# Patient Record
Sex: Male | Born: 1960 | Race: Black or African American | Hispanic: No | Marital: Married | Smoking: Never smoker
Health system: Southern US, Community
[De-identification: ages and names within clinical notes are randomized; demographics above are authoritative.]

## PROBLEM LIST (undated history)

## (undated) DIAGNOSIS — I5030 Unspecified diastolic (congestive) heart failure: Secondary | ICD-10-CM

## (undated) DIAGNOSIS — I4891 Unspecified atrial fibrillation: Secondary | ICD-10-CM

## (undated) DIAGNOSIS — G4730 Sleep apnea, unspecified: Secondary | ICD-10-CM

## (undated) DIAGNOSIS — I10 Essential (primary) hypertension: Secondary | ICD-10-CM

## (undated) DIAGNOSIS — I7121 Aneurysm of the ascending aorta, without rupture: Secondary | ICD-10-CM

## (undated) DIAGNOSIS — K760 Fatty (change of) liver, not elsewhere classified: Secondary | ICD-10-CM

## (undated) DIAGNOSIS — F1111 Opioid abuse, in remission: Secondary | ICD-10-CM

## (undated) DIAGNOSIS — K449 Diaphragmatic hernia without obstruction or gangrene: Secondary | ICD-10-CM

## (undated) DIAGNOSIS — K219 Gastro-esophageal reflux disease without esophagitis: Secondary | ICD-10-CM

## (undated) DIAGNOSIS — K429 Umbilical hernia without obstruction or gangrene: Secondary | ICD-10-CM

## (undated) DIAGNOSIS — E119 Type 2 diabetes mellitus without complications: Secondary | ICD-10-CM

## (undated) DIAGNOSIS — L02419 Cutaneous abscess of limb, unspecified: Secondary | ICD-10-CM

## (undated) DIAGNOSIS — I480 Paroxysmal atrial fibrillation: Secondary | ICD-10-CM

## (undated) HISTORY — PX: CARDIAC ELECTROPHYSIOLOGY MAPPING AND ABLATION: SHX1292

## (undated) HISTORY — PX: COLONOSCOPY: SHX174

---

## 2017-05-21 ENCOUNTER — Other Ambulatory Visit: Payer: Self-pay

## 2017-05-21 ENCOUNTER — Emergency Department
Admission: EM | Admit: 2017-05-21 | Discharge: 2017-05-21 | Disposition: A | Discharge status: Home / Self Care | Payer: Self-pay | Attending: Emergency Medicine | Admitting: Emergency Medicine

## 2017-05-21 DIAGNOSIS — L239 Allergic contact dermatitis, unspecified cause: Secondary | ICD-10-CM | POA: Insufficient documentation

## 2017-05-21 HISTORY — DX: Diaphragmatic hernia without obstruction or gangrene: K44.9

## 2017-05-21 MED ORDER — CYPROHEPTADINE HCL 4 MG PO TABS: 4.0000 mg | ORAL_TABLET | Freq: Three times a day (TID) | ORAL | 0 refills | Status: AC | PRN

## 2017-05-21 MED ORDER — PREDNISONE 10 MG PO TABS: 10.0000 mg | ORAL_TABLET | Freq: Two times a day (BID) | ORAL | 0 refills | Status: DC

## 2017-05-21 MED ORDER — RANITIDINE HCL 150 MG PO TABS: 150.0000 mg | ORAL_TABLET | Freq: Two times a day (BID) | ORAL | 0 refills | Status: AC

## 2017-05-21 MED ORDER — DEXAMETHASONE SODIUM PHOSPHATE 10 MG/ML IJ SOLN
10.0000 mg | Freq: Once | INTRAMUSCULAR | Status: AC
  Administered 2017-05-21: 10 mg via INTRAMUSCULAR
  Filled 2017-05-21: qty 1

## 2017-05-21 MED ORDER — FAMOTIDINE 20 MG PO TABS
40.0000 mg | ORAL_TABLET | Freq: Once | ORAL | Status: AC
  Administered 2017-05-21: 40 mg via ORAL
  Filled 2017-05-21: qty 2

## 2017-05-21 MED ORDER — TRIAMCINOLONE ACETONIDE 0.5 % EX OINT: 1.0000 "application " | TOPICAL_OINTMENT | Freq: Two times a day (BID) | CUTANEOUS | 0 refills | Status: AC

## 2017-05-21 NOTE — Discharge Instructions (Signed)
You have an allergic skin reaction to an unknown trigger, at this time. Use the cream as directed and the pills as prescribed. Avoid hot showers/baths. [facility], as needed.

## 2017-05-21 NOTE — ED Notes (Signed)
Pt c/o itchy rash on both arms x2 weeks. Pt reports he has tried multiple OTC medications with no relief.

## 2017-05-21 NOTE — ED Triage Notes (Signed)
Pt c/o itchy rash on arms and axillary area for the past week. States he has been using OTC meds that the pharmacist recommended with no relief.

## 2017-05-21 NOTE — ED Provider Notes (Signed)
[facility] Emergency Department Provider Note ____________________________________________  Time seen: 1815  I have reviewed the triage vital signs and the nursing notes.  HISTORY  Chief Complaint  Rash  HPI Louis Meza is a 57 y.o. male resents to the ED for evaluation of an itchy rash to the arms that is noted for the last 2 weeks at least.  Patient has been treating himself at home with various over-the-counter medications, recommended by local pharmacist as well as friends.  He denies any known contact or exposure.  He denies any history of eczema, atopic dermatitis, or sensitivities.  He denies any environmental exposures as well.  He notes itchy patches to the bilateral arms that he and some areas of scratched to the point of breaking the skin.  He denies any interim fevers, chills, or sweats.  He denies any significant medical history and takes no daily medications.  Past Medical History:  Diagnosis Date  . Hiatal hernia     There are no active problems to display for this patient.   History reviewed. No pertinent surgical history.  Prior to Admission medications   Medication Sig Start Date End Date Taking? Authorizing Provider  cyproheptadine (PERIACTIN) 4 MG tablet Take 1 tablet (4 mg total) by mouth 3 (three) times daily as needed for allergies. 05/21/17   Zuri Bradway, Charlesetta Ivory, PA-C  predniSONE (DELTASONE) 10 MG tablet Take 1 tablet (10 mg total) by mouth 2 (two) times daily with a meal. 05/21/17   Yesly Gerety, Charlesetta Ivory, PA-C  ranitidine (ZANTAC) 150 MG tablet Take 1 tablet (150 mg total) by mouth 2 (two) times daily. 05/21/17   Torell Minder, Charlesetta Ivory, PA-C  triamcinolone ointment (KENALOG) 0.5 % Apply 1 application topically 2 (two) times daily. Apply to the body BID 05/21/17   Chanie Soucek, Charlesetta Ivory, PA-C    Allergies Patient has no known allergies.  No family history on file.  Social History Social History   Tobacco Use  . Smoking  status: Never Smoker  . Smokeless tobacco: Never Used  Substance Use Topics  . Alcohol use: No    Frequency: Never  . Drug use: Not on file    Review of Systems  Constitutional: Negative for fever. Eyes: Negative for visual changes. ENT: Negative for sore throat. Cardiovascular: Negative for chest pain. Respiratory: Negative for shortness of breath. Musculoskeletal: Negative for back pain. Skin: Positive for rash. Neurological: Negative for headaches, focal weakness or numbness. ____________________________________________  PHYSICAL EXAM:  VITAL SIGNS: ED Triage Vitals [05/21/17 1734]  Enc Vitals Group     BP 134/88     Pulse Rate 93     Resp 17     Temp 98.1 F (36.7 C)     Temp Source Oral     SpO2 95 %     Weight 270 lb (122.5 kg)     Height 6' (1.829 m)     Head Circumference      Peak Flow      Pain Score      Pain Loc      Pain Edu?      Excl. in GC?     Constitutional: Alert and oriented. Well appearing and in no distress. Head: Normocephalic and atraumatic. Eyes: Conjunctivae are normal. PERRL. Normal extraocular movements Cardiovascular: Normal rate, regular rhythm. Normal distal pulses. Respiratory: Normal respiratory effort. No wheezes/rales/rhonchi. Musculoskeletal: Nontender with normal range of motion in all extremities.  Neurologic:  Normal gait without ataxia. Normal speech and  language. No gross focal neurologic deficits are appreciated. Skin:  Skin is warm, dry and intact.  Multiple maculopapular, erythematous lesions to the forearms bilaterally.  Some of the areas show signs of excoriation and local scabbing.  Some areas do show some papules.  No weeping, drainage, lymphangitis, or cellulitis is appreciated.  There is no warmth to palpation. ____________________________________________  PROCEDURES  Procedures Decadron 10 mg IM Famotidine 40 mg PO ____________________________________________  INITIAL IMPRESSION / ASSESSMENT AND PLAN / ED  COURSE  Patient with ED evaluation of a 2+ week complaint of bilateral upper extremity skin irritation.  Patient with no known exposures or contacts presents with what appears to be a contact dermatitis.  The underlying etiology at this time is still unknown.  Patient does report improvement of his symptoms of itching after administration of steroid and famotidine in the ED.  He will be discharged with prescriptions for prednisolone to take orally.  Periactin and ranitidine for histamine blockade, as well as a topical Kenalog and a 1:1 mixture with Eucerin cream.  He is advised to avoid hot showers in the interim.  [facility] or return to the ED as needed. ____________________________________________  FINAL CLINICAL IMPRESSION(S) / ED DIAGNOSES  Final diagnoses:  Allergic contact dermatitis, unspecified trigger      Karmen StabsMenshew, Charlesetta IvoryJenise V Bacon, PA-C 05/21/17 2053    Emily FilbertWilliams, Jonathan E, MD 05/21/17 2106

## 2021-03-31 ENCOUNTER — Ambulatory Visit
Admission: EM | Admit: 2021-03-31 | Discharge: 2021-03-31 | Disposition: A | Discharge status: Home / Self Care | Payer: Self-pay

## 2021-03-31 ENCOUNTER — Ambulatory Visit (INDEPENDENT_AMBULATORY_CARE_PROVIDER_SITE_OTHER): Payer: Self-pay

## 2021-03-31 ENCOUNTER — Other Ambulatory Visit: Payer: Self-pay

## 2021-03-31 DIAGNOSIS — J209 Acute bronchitis, unspecified: Secondary | ICD-10-CM

## 2021-03-31 DIAGNOSIS — R042 Hemoptysis: Secondary | ICD-10-CM

## 2021-03-31 DIAGNOSIS — R059 Cough, unspecified: Secondary | ICD-10-CM

## 2021-03-31 DIAGNOSIS — R0602 Shortness of breath: Secondary | ICD-10-CM

## 2021-03-31 DIAGNOSIS — Z7901 Long term (current) use of anticoagulants: Secondary | ICD-10-CM

## 2021-03-31 MED ORDER — PREDNISONE 20 MG PO TABS: 40.0000 mg | ORAL_TABLET | Freq: Every day | ORAL | 0 refills | Status: AC

## 2021-03-31 MED ORDER — DOXYCYCLINE HYCLATE 100 MG PO CAPS
100.0000 mg | ORAL_CAPSULE | Freq: Two times a day (BID) | ORAL | 0 refills | Status: AC
Start: 2021-03-31 — End: 2021-04-07

## 2021-03-31 MED ORDER — ALBUTEROL SULFATE HFA 108 (90 BASE) MCG/ACT IN AERS
1.0000 | INHALATION_SPRAY | Freq: Four times a day (QID) | RESPIRATORY_TRACT | 0 refills | Status: AC | PRN
Start: 2021-03-31 — End: ?

## 2021-03-31 MED ORDER — CHERATUSSIN AC 100-10 MG/5ML PO SOLN: 10.0000 mL | Freq: Four times a day (QID) | ORAL | 0 refills | Status: AC | PRN

## 2021-03-31 NOTE — ED Triage Notes (Signed)
Pt c/o cough with blood x4days. Pt took an at home covid test and it was negative. Pt states that he has gotten worse with both coughing and SOB. Pt has a picture of bloody phlegm from this morning.

## 2021-03-31 NOTE — Discharge Instructions (Signed)
-  Your x-ray was clear.  No evidence of pneumonia but you do have bronchitis. - I think the reason why you are having some blood in the sputum is because you are on a blood thinning medication and because you are coughing so forcefully that you have ruptured small blood vessels in your airway.  Try not to cough so hard.  I sent a cough medication which should help we need to make sure to drink plenty of fluids. - This is most likely viral but given the severity of her symptoms, I have sent antibiotics which hopefully help. - I have also sent an inhaler to help open things up. - We discussed other possibilities for your symptoms including internal bleeding and possible blood clot in your lung.  If you are not improving or your symptoms are worsening you may need to go to the emergency department for CT imaging of your chest.  Unfortunately we do not have that in urgent care as I discussed with you.  If you develop a fever, increased shortness of breath, pain on breathing, or your hemoptysis is getting worse over the next couple of days or is not improving, you need to go to the ER.

## 2021-03-31 NOTE — ED Provider Notes (Signed)
[facility]    CSN: [id number] Arrival date & time: 03/31/21  1100      History   Chief Complaint Chief Complaint  Patient presents with   Cough    HPI Louis Meza is a 61 y.o. male with history of atrial fibrillation with chronic anticoagulant use, obstructive sleep apnea, chronic heart failure, and obesity.  Patient is presenting today for 5-day history of productive cough.  He says cough is productive of yellowish sputum.  He says since yesterday he has noticed blood streaks in the sputum and became concerned.  Reports shortness of breath.  He says he feels like he has to cough very hard to get the mucus up.  Cough interrupts sleep.  He denies any associated fevers.  Has had some fatigue.  Has noticed some wheezing.  Patient reports symptoms have gotten worse and not better.  He has taken 2 COVID tests at home which have both been negative.  Denies any COVID exposure.  Has been taking over-the-counter cough medicine without improvement in symptoms.  Patient reports he used to smoke and would have bronchitis from time to time and symptoms feels sort of similar to that.  Reports its been a long time since he has had anything like that.  No history of PE or DVT.  Patient says he is compliant with all his medications.  No other complaints.  HPI  Past Medical History:  Diagnosis Date   Hiatal hernia     There are no problems to display for this patient.   History reviewed. No pertinent surgical history.     Home Medications    Prior to Admission medications   Medication Sig Start Date End Date Taking? Authorizing Provider  albuterol (VENTOLIN HFA) 108 (90 Base) MCG/ACT inhaler Inhale 1-2 puffs into the lungs every 6 (six) hours as needed for wheezing or shortness of breath. 03/31/21  Yes Eusebio Friendly B, PA-C  apixaban (ELIQUIS) 5 MG TABS tablet Take by mouth. 06/12/20  Yes [provider]  cyproheptadine (PERIACTIN) 4 MG tablet Take 1 tablet (4 mg total) by  mouth 3 (three) times daily as needed for allergies. 05/21/17  Yes Menshew, Charlesetta Ivory, PA-C  diltiazem (CARDIZEM) 30 MG tablet Take 30 mg by mouth 2 (two) times daily as needed. 12/23/20  Yes [provider]  doxycycline (VIBRAMYCIN) 100 MG capsule Take 1 capsule (100 mg total) by mouth 2 (two) times daily for 7 days. 03/31/21 04/07/21 Yes Eusebio Friendly B, PA-C  furosemide (LASIX) 20 MG tablet Take 20 mg by mouth daily as needed. 11/19/20  Yes [provider]  guaiFENesin-codeine (CHERATUSSIN AC) 100-10 MG/5ML syrup Take 10 mLs by mouth 4 (four) times daily as needed for cough. 03/31/21  Yes Shirlee Latch, PA-C  JARDIANCE 10 MG TABS tablet Take 10 mg by mouth daily. 12/10/20  Yes [provider]  lisinopril (ZESTRIL) 5 MG tablet Take 5 mg by mouth daily. 03/16/21  Yes [provider]  omeprazole (PRILOSEC) 20 MG capsule Take by mouth.   Yes [provider]  predniSONE (DELTASONE) 20 MG tablet Take 2 tablets (40 mg total) by mouth daily for 5 days. 03/31/21 04/05/21 Yes Shirlee Latch, PA-C  ranitidine (ZANTAC) 150 MG tablet Take 1 tablet (150 mg total) by mouth 2 (two) times daily. 05/21/17  Yes Menshew, Charlesetta Ivory, PA-C  sildenafil (VIAGRA) 50 MG tablet Take 50 mg by mouth daily. 02/10/21  Yes [provider]  sotalol (BETAPACE) 120 MG  tablet SMARTSIG:1 Tablet(s) By Mouth Every 12 Hours 02/26/21  Yes [provider]  spironolactone (ALDACTONE) 25 MG tablet Take 12.5 mg by mouth daily. 10/22/20  Yes [provider]  triamcinolone ointment (KENALOG) 0.5 % Apply 1 application topically 2 (two) times daily. Apply to the body BID 05/21/17  Yes Menshew, Charlesetta IvoryJenise V Bacon, PA-C    Family History History reviewed. No pertinent family history.  Social History Social History   Tobacco Use   Smoking status: Never   Smokeless tobacco: Never  Vaping Use   Vaping Use: Never used  Substance Use Topics   Alcohol use: No   Drug use: Never      Allergies   Patient has no known allergies.   Review of Systems Review of Systems  Constitutional:  Positive for fatigue. Negative for fever.  HENT:  Positive for congestion and rhinorrhea. Negative for sinus pressure, sinus pain and sore throat.   Respiratory:  Positive for cough, shortness of breath and wheezing.   Cardiovascular:  Negative for chest pain and palpitations.  Gastrointestinal:  Negative for abdominal pain, diarrhea, nausea and vomiting.  Musculoskeletal:  Negative for myalgias.  Neurological:  Negative for weakness, light-headedness and headaches.  Psychiatric/Behavioral:  Positive for sleep disturbance (due to cough).     Physical Exam Triage Vital Signs ED Triage Vitals  Enc Vitals Group     BP 03/31/21 1211 (!) 135/56     Pulse Rate 03/31/21 1211 (!) 115     Resp 03/31/21 1211 18     Temp 03/31/21 1211 98.4 F (36.9 C)     Temp Source 03/31/21 1211 Oral     SpO2 03/31/21 1211 95 %     Weight 03/31/21 1207 275 lb (124.7 kg)     Height 03/31/21 1207 6' (1.829 m)     Head Circumference --      Peak Flow --      Pain Score 03/31/21 1208 0     Pain Loc --      Pain Edu? --      Excl. in GC? --    No data found.  Updated Vital Signs BP (!) 135/56 (BP Location: Left Arm)    Pulse (!) 115    Temp 98.4 F (36.9 C) (Oral)    Resp 18    Ht 6' (1.829 m)    Wt 275 lb (124.7 kg)    SpO2 95%    BMI 37.30 kg/m      Physical Exam Vitals and nursing note reviewed.  Constitutional:      General: He is not in acute distress.    Appearance: Normal appearance. He is well-developed. He is ill-appearing.  HENT:     Head: Normocephalic and atraumatic.     Nose: Congestion present.     Mouth/Throat:     Mouth: Mucous membranes are moist.     Pharynx: Oropharynx is clear.  Eyes:     Conjunctiva/sclera: Conjunctivae normal.  Cardiovascular:     Rate and Rhythm: Regular rhythm. Tachycardia present.     Heart sounds: Normal heart sounds.  Pulmonary:      Effort: Pulmonary effort is normal. No respiratory distress.     Breath sounds: Wheezing and rhonchi present. No rales.     Comments: Diffuse wheezing and rhonchi throughout all lung fields Musculoskeletal:        General: No swelling.     Cervical back: Neck supple.  Skin:    General: Skin is warm and dry.  Capillary Refill: Capillary refill takes less than 2 seconds.  Neurological:     General: No focal deficit present.     Mental Status: He is alert. Mental status is at baseline.     Motor: No weakness.     Coordination: Coordination normal.     Gait: Gait normal.  Psychiatric:        Mood and Affect: Mood normal.        Behavior: Behavior normal.        Thought Content: Thought content normal.     UC Treatments / Results  Labs (all labs ordered are listed, but only abnormal results are displayed) Labs Reviewed - No data to display  EKG   Radiology DG Chest 2 View  Result Date: 03/31/2021 CLINICAL DATA:  Cough, hemoptysis, shortness of breath EXAM: CHEST - 2 VIEW COMPARISON:  None. FINDINGS: Cardiac size is within normal limits. Thoracic aorta is tortuous and ectatic. There are no signs of pulmonary edema or focal pulmonary consolidation. There is no pleural effusion or pneumothorax. IMPRESSION: No active cardiopulmonary disease. Electronically Signed   By: Ernie AvenaPalani  Rathinasamy M.D.   On: 03/31/2021 12:36    Procedures Procedures (including critical care time)  Medications Ordered in UC Medications - No data to display  Initial Impression / Assessment and Plan / UC Course  I have reviewed the triage vital signs and the nursing notes.  Pertinent labs & imaging results that were available during my care of the patient were reviewed by me and considered in my medical decision making (see chart for details).  61 year old male presenting for 5-day history of productive cough.  Cough productive of yellowish sputum that is thick.  Has become blood-tinged since yesterday.   He says he has had 4 episodes of blood-tinged sputum.  Reports that he does cough forcefully.  Reports shortness of breath and wheezing.  No pleuritic pain.  Patient is tachycardic.  He is afebrile.  Oxygen stable at 95%.  He is in no respiratory distress.  On exam he is mildly ill-appearing but nontoxic.  Coughs a lot during exam.  Nasal congestion on exam.  Diffuse wheezing and rhonchi throughout chest.  Chest x-ray performed today shows no acute cardiopulmonary disease.  Discussed this with patient.  Suspect patient likely has bronchitis and since he is on Eliquis and is coughing so forcefully, he is caused small blood vessel ruptures leading to mild bleeding.  Discussed with patient the other possibility which could be internal bleeding in his chest versus PE, malignancy.  Advised he may need a CT if he is not improving or if he develops a fever, pleuritic pain, increased shortness of breath or notices more hemoptysis.  Discussed that if he has clumps or clots of blood or if the sputum is just completely bloody, he needs to go to emergency department for CT of chest.  I was able to see a picture of his sputum which showed thick yellowish-green sputum with streaks of dark brown which could represent blood.  No bright red blood and no clots or clots of blood.  Patient would like to hold off on going to ED at this time for the CTA and would like to be treated.  Sent prednisone, doxycycline, albuterol and Cheratussin.  Advised him against coughing so forcefully.  Again reviewed ED precautions with him.  Patient agrees to plan.   Final Clinical Impressions(s) / UC Diagnoses   Final diagnoses:  Acute bronchitis, unspecified organism  Hemoptysis  Shortness of breath  Long term current use of anticoagulant therapy     Discharge Instructions      -Your x-ray was clear.  No evidence of pneumonia but you do have bronchitis. - I think the reason why you are having some blood in the sputum is because  you are on a blood thinning medication and because you are coughing so forcefully that you have ruptured small blood vessels in your airway.  Try not to cough so hard.  I sent a cough medication which should help we need to make sure to drink plenty of fluids. - This is most likely viral but given the severity of her symptoms, I have sent antibiotics which hopefully help. - I have also sent an inhaler to help open things up. - We discussed other possibilities for your symptoms including internal bleeding and possible blood clot in your lung.  If you are not improving or your symptoms are worsening you may need to go to the emergency department for CT imaging of your chest.  Unfortunately we do not have that in urgent care as I discussed with you.  If you develop a fever, increased shortness of breath, pain on breathing, or your hemoptysis is getting worse over the next couple of days or is not improving, you need to go to the ER.     ED Prescriptions     Medication Sig Dispense Auth. Provider   predniSONE (DELTASONE) 20 MG tablet Take 2 tablets (40 mg total) by mouth daily for 5 days. 10 tablet Eusebio Friendly B, PA-C   doxycycline (VIBRAMYCIN) 100 MG capsule Take 1 capsule (100 mg total) by mouth 2 (two) times daily for 7 days. 14 capsule Eusebio Friendly B, PA-C   albuterol (VENTOLIN HFA) 108 (90 Base) MCG/ACT inhaler Inhale 1-2 puffs into the lungs every 6 (six) hours as needed for wheezing or shortness of breath. 1 g Eusebio Friendly B, PA-C   guaiFENesin-codeine (CHERATUSSIN AC) 100-10 MG/5ML syrup Take 10 mLs by mouth 4 (four) times daily as needed for cough. 118 mL Shirlee Latch, PA-C      I have reviewed the PDMP during this encounter.   Shirlee Latch, PA-C 03/31/21 1319

## 2021-05-29 DIAGNOSIS — R0600 Dyspnea, unspecified: Secondary | ICD-10-CM | POA: Diagnosis not present

## 2021-05-29 DIAGNOSIS — R002 Palpitations: Secondary | ICD-10-CM | POA: Diagnosis not present

## 2021-06-18 DIAGNOSIS — I4891 Unspecified atrial fibrillation: Secondary | ICD-10-CM | POA: Diagnosis not present

## 2022-04-26 ENCOUNTER — Ambulatory Visit
Admission: EM | Admit: 2022-04-26 | Discharge: 2022-04-26 | Disposition: A | Discharge status: Home / Self Care | Payer: Self-pay

## 2022-04-26 ENCOUNTER — Ambulatory Visit (INDEPENDENT_AMBULATORY_CARE_PROVIDER_SITE_OTHER): Payer: Self-pay

## 2022-04-26 DIAGNOSIS — Z8614 Personal history of Methicillin resistant Staphylococcus aureus infection: Secondary | ICD-10-CM

## 2022-04-26 DIAGNOSIS — S8992XA Unspecified injury of left lower leg, initial encounter: Secondary | ICD-10-CM

## 2022-04-26 DIAGNOSIS — L089 Local infection of the skin and subcutaneous tissue, unspecified: Secondary | ICD-10-CM

## 2022-04-26 DIAGNOSIS — T148XXA Other injury of unspecified body region, initial encounter: Secondary | ICD-10-CM

## 2022-04-26 DIAGNOSIS — L03116 Cellulitis of left lower limb: Secondary | ICD-10-CM

## 2022-04-26 MED ORDER — DOXYCYCLINE HYCLATE 100 MG PO CAPS
100.0000 mg | ORAL_CAPSULE | Freq: Two times a day (BID) | ORAL | 0 refills | Status: AC
Start: 2022-04-26 — End: 2022-05-03

## 2022-04-26 MED ORDER — CEFTRIAXONE SODIUM 1 G IJ SOLR
1.0000 g | Freq: Once | INTRAMUSCULAR | Status: AC
  Administered 2022-04-26: 1 g via INTRAMUSCULAR

## 2022-04-26 MED ORDER — CEPHALEXIN 500 MG PO CAPS: 500.0000 mg | ORAL_CAPSULE | Freq: Four times a day (QID) | ORAL | 0 refills | Status: AC

## 2022-04-26 NOTE — Discharge Instructions (Signed)
-  The x-ray does not show any bony abnormality. - We have given you an injection of an antibiotic in the clinic today.  I have sent antibiotics to pharmacy.  Start the doxycycline right away.  Take the Keflex starting tomorrow.  Elevate your lower extremity to help with swelling.  I would also like you to apply warm to the lower leg. - If no improvement in the next 48 hours or symptoms worsen develop fever, go to emergency department.  Otherwise, follow-up with PCP.

## 2022-04-26 NOTE — ED Triage Notes (Signed)
Pt states he had a fall and scraped LT lower leg DOI: x1 week ago

## 2022-04-26 NOTE — ED Provider Notes (Signed)
[facility]    CSN: [id number] Arrival date & time: 04/26/22  1209      History   Chief Complaint No chief complaint on file.   HPI Louis Meza is a 62 y.o. male presenting for evaluation of an area of swelling, redness and bruising of the left anterior shin for the past 1 week.  He says that he banged his lower leg on something last week and he had a bruise.  He reports that the swelling and pain has worsened over the past few days.  He is concerned about an MRSA infection.  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  He has been cleaning the area and elevating the leg without any relief.  He has not had any fevers.  He has not noticed any pustular drainage and he has not had any bleeding.  No other concerns.  HPI  Past Medical History:  Diagnosis Date   Hiatal hernia     There are no problems to display for this patient.   History reviewed. No pertinent surgical history.     Home Medications    Prior to Admission medications   Medication Sig Start Date End Date Taking? Authorizing Provider  apixaban (ELIQUIS) 5 MG TABS tablet Take by mouth. 06/12/20  Yes [provider]  cephALEXin (KEFLEX) 500 MG capsule Take 1 capsule (500 mg total) by mouth 4 (four) times daily for 7 days. 04/26/22 05/03/22 Yes Danton Clap, PA-C  cyproheptadine (PERIACTIN) 4 MG tablet Take 1 tablet (4 mg total) by mouth 3 (three) times daily as needed for allergies. 05/21/17  Yes Menshew, Dannielle Karvonen, PA-C  diltiazem (CARDIZEM) 30 MG tablet Take 30 mg by mouth 2 (two) times daily as needed. 12/23/20  Yes [provider]  doxycycline (VIBRAMYCIN) 100 MG capsule Take 1 capsule (100 mg total) by mouth 2 (two) times daily for 7 days. 04/26/22 05/03/22 Yes Laurene Footman B, PA-C  furosemide (LASIX) 20 MG tablet Take 20 mg by mouth daily as needed. 11/19/20  Yes [provider]  JARDIANCE 10 MG TABS tablet Take 10 mg by mouth  daily. 12/10/20  Yes [provider]  lisinopril (ZESTRIL) 5 MG tablet Take 5 mg by mouth daily. 03/16/21  Yes [provider]  omeprazole (PRILOSEC) 20 MG capsule Take by mouth.   Yes [provider]  ranitidine (ZANTAC) 150 MG tablet Take 1 tablet (150 mg total) by mouth 2 (two) times daily. 05/21/17  Yes Menshew, Dannielle Karvonen, PA-C  spironolactone (ALDACTONE) 25 MG tablet Take 12.5 mg by mouth daily. 10/22/20  Yes [provider]  albuterol (VENTOLIN HFA) 108 (90 Base) MCG/ACT inhaler Inhale 1-2 puffs into the lungs every 6 (six) hours as needed for wheezing or shortness of breath. 03/31/21   Danton Clap, PA-C  atorvastatin (LIPITOR) 40 MG tablet Take 40 mg by mouth daily.    [provider]  guaiFENesin-codeine (CHERATUSSIN AC) 100-10 MG/5ML syrup Take 10 mLs by mouth 4 (four) times daily as needed for cough. 03/31/21   Danton Clap, PA-C  sildenafil (VIAGRA) 50 MG tablet Take 50 mg by mouth daily. 02/10/21   [provider]  sotalol (BETAPACE) 120 MG tablet SMARTSIG:1 Tablet(s) By Mouth Every 12 Hours 02/26/21   [provider]  triamcinolone ointment (KENALOG) 0.5 % Apply 1 application topically 2 (two) times daily. Apply to the body BID 05/21/17   Menshew, Dannielle Karvonen,  PA-C    Family History History reviewed. No pertinent family history.  Social History Social History   Tobacco Use   Smoking status: Never   Smokeless tobacco: Never  Vaping Use   Vaping Use: Never used  Substance Use Topics   Alcohol use: No   Drug use: Never     Allergies   Patient has no known allergies.   Review of Systems Review of Systems  Constitutional:  Negative for fatigue and fever.  Musculoskeletal:  Positive for arthralgias and joint swelling. Negative for gait problem.  Skin:  Positive for color change and wound.  Neurological:  Negative for weakness and numbness.     Physical Exam Triage Vital Signs ED Triage Vitals  [04/26/22 1306]  Enc Vitals Group     BP      Pulse      Resp      Temp      Temp src      SpO2      Weight 280 lb (127 kg)     Height 6' (1.829 m)     Head Circumference      Peak Flow      Pain Score 7     Pain Loc      Pain Edu?      Excl. in GC?    No data found.  Updated Vital Signs BP 124/75 (BP Location: Left Arm)   Pulse 63   Temp 97.7 F (36.5 C) (Oral)   Ht 6' (1.829 m)   Wt 280 lb (127 kg)   SpO2 95%   BMI 37.97 kg/m       Physical Exam Vitals and nursing note reviewed.  Constitutional:      General: He is not in acute distress.    Appearance: Normal appearance. He is well-developed. He is not ill-appearing.  HENT:     Head: Normocephalic and atraumatic.  Eyes:     Conjunctiva/sclera: Conjunctivae normal.  Cardiovascular:     Rate and Rhythm: Normal rate and regular rhythm.  Pulmonary:     Effort: Pulmonary effort is normal. No respiratory distress.     Breath sounds: Normal breath sounds.  Musculoskeletal:     Cervical back: Neck supple.  Skin:    General: Skin is warm and dry.     Capillary Refill: Capillary refill takes less than 2 seconds.     Comments: Left lower leg: There is an area of erythema, swelling, warmth and induration of the anterior lower leg (9 cm x 7 cm)  There is also a shallow open wound without drainage or bleeding.  The area is warm to touch and very tender to palpation.  Neurological:     General: No focal deficit present.     Mental Status: He is alert. Mental status is at baseline.     Motor: No weakness.     Gait: Gait normal.  Psychiatric:        Mood and Affect: Mood normal.        Behavior: Behavior normal.      UC Treatments / Results  Labs (all labs ordered are listed, but only abnormal results are displayed) Labs Reviewed - No data to display  EKG   Radiology DG Tibia/Fibula Left  Result Date: 04/26/2022 CLINICAL DATA:  Trauma to the lower leg last week with persistent contusion. EXAM: LEFT TIBIA  AND FIBULA - 2 VIEW COMPARISON:  None Available. FINDINGS: There is no evidence of fracture or other focal bone  lesions. Soft tissues are unremarkable. IMPRESSION: Negative. Electronically Signed   By: Abelardo Diesel M.D.   On: 04/26/2022 13:45    Procedures Procedures (including critical care time)  Medications Ordered in UC Medications  cefTRIAXone (ROCEPHIN) injection 1 g (has no administration in time range)    Initial Impression / Assessment and Plan / UC Course  I have reviewed the triage vital signs and the nursing notes.  Pertinent labs & imaging results that were available during my care of the patient were reviewed by me and considered in my medical decision making (see chart for details).   62 year old male presents for redness, pain, swelling of the left lower leg that is worsened over the past week.  He did injure it on something a week ago.  He is on anticoagulants long-term.  No fever.  History of MRSA requiring hospitalization in the past.  He is afebrile and overall well-appearing.  See image in chart.  Patient has a large area of erythema, deep purpleish discoloration, swelling, induration of the left anterior lower leg.  There is also a shallow wound without drainage or bleeding/  X-ray tib-fib obtained.  X-ray normal.  Discussed with patient.  Suspect infected hematoma/cellulitis.  Given his history of MRSA, will treat him aggressively.  Patient given 1 g Rocephin in clinic and I also sent prescription for Keflex and doxycycline to pharmacy.  Reviewed wound care and supportive care guidelines.  Reviewed return and ER precautions as well.  I did draw a line around the area of redness and I advised him that if his symptoms or not improving in 48 hours or they worsen he should go to the ER.   Final Clinical Impressions(s) / UC Diagnoses   Final diagnoses:  Infected hematoma  Cellulitis of left lower leg  Injury of left lower extremity, initial encounter  History of MRSA  infection     Discharge Instructions      -The x-ray does not show any bony abnormality. - We have given you an injection of an antibiotic in the clinic today.  I have sent antibiotics to pharmacy.  Start the doxycycline right away.  Take the Keflex starting tomorrow.  Elevate your lower extremity to help with swelling.  I would also like you to apply warm to the lower leg. - If no improvement in the next 48 hours or symptoms worsen develop fever, go to emergency department.  Otherwise, follow-up with PCP.     ED Prescriptions     Medication Sig Dispense Auth. Provider   cephALEXin (KEFLEX) 500 MG capsule Take 1 capsule (500 mg total) by mouth 4 (four) times daily for 7 days. 28 capsule Laurene Footman B, PA-C   doxycycline (VIBRAMYCIN) 100 MG capsule Take 1 capsule (100 mg total) by mouth 2 (two) times daily for 7 days. 14 capsule Danton Clap, PA-C      PDMP not reviewed this encounter.   Danton Clap, PA-C 04/26/22 1400

## 2022-07-19 ENCOUNTER — Encounter: Payer: Self-pay | Admitting: Emergency Medicine

## 2022-07-19 ENCOUNTER — Other Ambulatory Visit: Payer: Self-pay

## 2022-07-19 ENCOUNTER — Emergency Department
Admission: EM | Admit: 2022-07-19 | Discharge: 2022-07-19 | Disposition: A | Discharge status: Home / Self Care | Payer: 59 | Attending: Emergency Medicine | Admitting: Emergency Medicine

## 2022-07-19 DIAGNOSIS — J449 Chronic obstructive pulmonary disease, unspecified: Secondary | ICD-10-CM | POA: Diagnosis not present

## 2022-07-19 DIAGNOSIS — Z7901 Long term (current) use of anticoagulants: Secondary | ICD-10-CM | POA: Diagnosis not present

## 2022-07-19 DIAGNOSIS — I480 Paroxysmal atrial fibrillation: Secondary | ICD-10-CM | POA: Insufficient documentation

## 2022-07-19 DIAGNOSIS — R002 Palpitations: Secondary | ICD-10-CM | POA: Diagnosis present

## 2022-07-19 DIAGNOSIS — E119 Type 2 diabetes mellitus without complications: Secondary | ICD-10-CM | POA: Insufficient documentation

## 2022-07-19 DIAGNOSIS — R000 Tachycardia, unspecified: Secondary | ICD-10-CM | POA: Diagnosis not present

## 2022-07-19 LAB — BASIC METABOLIC PANEL
Anion gap: 9 (ref 5–15)
BUN: 16 mg/dL (ref 8–23)
CO2: 23 mmol/L (ref 22–32)
Calcium: 8.9 mg/dL (ref 8.9–10.3)
Chloride: 105 mmol/L (ref 98–111)
Creatinine, Ser: 1.23 mg/dL (ref 0.61–1.24)
GFR, Estimated: >60 mL/min (ref >60)
Glucose, Bld: 132 mg/dL — ABNORMAL HIGH (ref 70–99)
Potassium: 3.5 mmol/L (ref 3.5–5.1)
Sodium: 137 mmol/L (ref 135–145)

## 2022-07-19 LAB — CBC
HCT: 45.5 % (ref 39.0–52.0)
Hemoglobin: 15.2 g/dL (ref 13.0–17.0)
MCH: 29.6 pg (ref 26.0–34.0)
MCHC: 33.4 g/dL (ref 30.0–36.0)
MCV: 88.7 fL (ref 80.0–100.0)
Platelets: 176 10*3/uL (ref 150–400)
RBC: 5.13 MIL/uL (ref 4.22–5.81)
RDW: 13.4 % (ref 11.5–15.5)
WBC: 7.7 10*3/uL (ref 4.0–10.5)
nRBC: 0 % (ref 0.0–0.2)

## 2022-07-19 LAB — TROPONIN I (HIGH SENSITIVITY): Troponin I (High Sensitivity): 5 ng/L (ref <18)

## 2022-07-19 MED ORDER — SOTALOL HCL 120 MG PO TABS: 120.0000 mg | ORAL_TABLET | Freq: Two times a day (BID) | ORAL | 0 refills | Status: AC

## 2022-07-19 MED ORDER — SOTALOL HCL 80 MG PO TABS
120.0000 mg | ORAL_TABLET | ORAL | Status: AC
  Administered 2022-07-19: 120 mg via ORAL
  Filled 2022-07-19: qty 1.5

## 2022-07-19 NOTE — ED Triage Notes (Signed)
Patient ambulatory to triage with steady gait, without difficulty or distress noted; pt reports having palpitations tonight; denies any pain, no accomp symptoms; st ran out of sotalol and unable to get a refill without an authorization from his doc

## 2022-07-19 NOTE — ED Provider Notes (Signed)
[facility] Provider Note    Event Date/Time   First MD Initiated Contact with Patient 07/19/22 2129     (approximate)   History   Palpitations   HPI  Louis Meza is a 62 y.o. male   reports that he noticed his heart rate seems a little higher than typical.  He had an ablation in November.  He has a history of A-fib he is currently taking apixaban.  He ran out of his sotalol on Friday, he was not able to get it refilled by his cardiologist office yet.  He has been taking it for quite some time 120 mg twice a day.  He is noticed on his watch/heart tracker that his heart rate has been running higher than typical usually its about 70 it has been running from about 90-100/110 for the last day.  Feels like his heart is just a little bit faster than typical no chest pain no trouble breathing.  Attempted to get his medication refilled through his cardiologist but the not gotten back to him yet.  Pharmacy also reaching out.  He reports he went to go get his medicine on Friday but was told that he did not have any active refills.  No leg swelling.  No fevers or chills.  Resting comfortably at this time    patient is noted to be on Eliquis cyproheptadine, Cardizem, Lasix, lisinopril  [facility] records reviewed patient has a history of heart failure with preserved EF, steatosis, atrial fibrillation on apixaban, sotalol diabetes and COPD.  History of episodes of paroxysmal A-fib had previous direct current cardioversion  Physical Exam   Triage Vital Signs: ED Triage Vitals  Enc Vitals Group     BP 07/19/22 2018 131/86     Pulse Rate 07/19/22 2018 (!) 105     Resp 07/19/22 2018 18     Temp 07/19/22 2018 98.3 F (36.8 C)     Temp Source 07/19/22 2018 Oral     SpO2 07/19/22 2018 98 %     Weight 07/19/22 2010 280 lb (127 kg)     Height 07/19/22 2010 6' (1.829 m)     Head Circumference --      Peak Flow --      Pain Score 07/19/22 2010 0     Pain Loc --      Pain Edu? --       Excl. in GC? --     Most recent vital signs: Vitals:   07/19/22 2018 07/19/22 2130  BP: 131/86 103/67  Pulse: (!) 105 95  Resp: 18 (!) 28  Temp: 98.3 F (36.8 C)   SpO2: 98% 94%     General: Awake, no distress.  CV:  Good peripheral perfusion.  Normal tones heart rate 90-95, normal sinus rhythm on telemetry.  Clear lungs bilateral normal work of breathing Resp:  Normal effort.  Abd:  No distention.  Other:  No lower extremity edema venous cords or congestion   ED Results / Procedures / Treatments   Labs (all labs ordered are listed, but only abnormal results are displayed) Labs Reviewed  BASIC METABOLIC PANEL - Abnormal; Notable for the following components:      Result Value   Glucose, Bld 132 (*)    All other components within normal limits  CBC  TROPONIN I (HIGH SENSITIVITY)  TROPONIN I (HIGH SENSITIVITY)     EKG  Interpreted by me at 2017 heart rate 100 QRS 90 QTc 440 Sinus tachycardia.  No evidence of acute ischemia   RADIOLOGY     PROCEDURES:  Critical Care performed: No  Procedures   MEDICATIONS ORDERED IN ED: Medications  sotalol (BETAPACE) tablet 120 mg (has no administration in time range)     IMPRESSION / MDM / ASSESSMENT AND PLAN / ED COURSE  I reviewed the triage vital signs and the nursing notes.                              Differential diagnosis includes, but is not limited to, likely effect of medication discontinuation.  Patient currently in sinus rhythm, heart rate 90-100.  Asymptomatic except to minor notation that he feels like his heart rate is slightly elevated from his baseline.  He is resting comfortably at this time reassuring examination no associated chest pain.  Electrolytes and troponin are normal  Discussed with patient reviewed notes from [facility], it appears the patient is to be on sotalol 120 mg twice daily.  Notes indicate that in the future there was consideration to have EP reevaluate if he needs to continue  this or taper off, but does not appear the patient had a plan to suddenly abruptly discontinue sotalol.  Discussed with patient, we we will give a dose here, and refill his prescription.  He is already contacted [facility] but has not yet heard back from his team yet on his refill request  Discussed return precautions including fever to have shortness of breath, chest pain, extremely high heart rate 120 or 140 or otherwise that is causing him symptoms etc. he should return to the ER right away.  Patient in agreement understand with plan.  Patient's presentation is most consistent with acute complicated illness / injury requiring diagnostic workup.   The patient is on the cardiac monitor to evaluate for evidence of arrhythmia and/or significant heart rate changes.     Vitals:   07/19/22 2018 07/19/22 2130  BP: 131/86 103/67  Pulse: (!) 105 95  Resp: 18 (!) 28  Temp: 98.3 F (36.8 C)   SpO2: 98% 94%     FINAL CLINICAL IMPRESSION(S) / ED DIAGNOSES   Final diagnoses:  Palpitations     Rx / DC Orders   ED Discharge Orders          Ordered    sotalol (BETAPACE) 120 MG tablet  2 times daily        07/19/22 2140             Note:  This document was prepared using Dragon voice recognition software and may include unintentional dictation errors.   Sharyn Creamer, MD 07/19/22 2149

## 2023-03-25 ENCOUNTER — Encounter: Payer: Self-pay | Admitting: Internal Medicine

## 2023-04-13 ENCOUNTER — Ambulatory Visit: Admission: RE | Admit: 2023-04-13 | Payer: 59 | Source: Home / Self Care | Admitting: Internal Medicine

## 2023-04-13 HISTORY — DX: Unspecified diastolic (congestive) heart failure: I50.30

## 2023-04-13 HISTORY — DX: Gastro-esophageal reflux disease without esophagitis: K21.9

## 2023-04-13 HISTORY — DX: Fatty (change of) liver, not elsewhere classified: K76.0

## 2023-04-13 HISTORY — DX: Aneurysm of the ascending aorta, without rupture: I71.21

## 2023-04-13 HISTORY — DX: Type 2 diabetes mellitus without complications: E11.9

## 2023-04-13 HISTORY — DX: Unspecified atrial fibrillation: I48.91

## 2023-04-13 HISTORY — DX: Paroxysmal atrial fibrillation: I48.0

## 2023-04-13 HISTORY — DX: Sleep apnea, unspecified: G47.30

## 2023-04-13 SURGERY — COLONOSCOPY WITH PROPOFOL
Anesthesia: General

## 2023-05-20 ENCOUNTER — Encounter: Payer: Self-pay | Admitting: Internal Medicine

## 2023-05-31 ENCOUNTER — Encounter: Payer: Self-pay | Admitting: Internal Medicine

## 2023-05-31 ENCOUNTER — Encounter: Payer: Self-pay | Admitting: Anesthesiology

## 2023-06-01 ENCOUNTER — Encounter: Payer: Self-pay | Admitting: Anesthesiology

## 2023-06-01 ENCOUNTER — Ambulatory Visit: Admission: RE | Admit: 2023-06-01 | Payer: 59 | Source: Home / Self Care | Admitting: Internal Medicine

## 2023-06-01 SURGERY — COLONOSCOPY WITH PROPOFOL
Anesthesia: General

## 2023-07-14 ENCOUNTER — Encounter: Payer: Self-pay | Admitting: Internal Medicine

## 2023-08-29 ENCOUNTER — Ambulatory Visit: Admission: RE | Admit: 2023-08-29 | Source: Home / Self Care | Admitting: Internal Medicine

## 2023-08-29 HISTORY — DX: Opioid abuse, in remission: F11.11

## 2023-08-29 HISTORY — DX: Essential (primary) hypertension: I10

## 2023-08-29 HISTORY — DX: Umbilical hernia without obstruction or gangrene: K42.9

## 2023-08-29 HISTORY — DX: Cutaneous abscess of limb, unspecified: L02.419

## 2023-08-29 SURGERY — COLONOSCOPY
Anesthesia: General

## 2023-10-29 DIAGNOSIS — R0602 Shortness of breath: Secondary | ICD-10-CM | POA: Diagnosis not present

## 2023-10-29 DIAGNOSIS — I4811 Longstanding persistent atrial fibrillation: Secondary | ICD-10-CM | POA: Diagnosis not present

## 2023-10-29 DIAGNOSIS — R000 Tachycardia, unspecified: Secondary | ICD-10-CM | POA: Diagnosis not present

## 2023-10-29 DIAGNOSIS — I4891 Unspecified atrial fibrillation: Secondary | ICD-10-CM | POA: Diagnosis not present

## 2023-11-15 DIAGNOSIS — G4733 Obstructive sleep apnea (adult) (pediatric): Secondary | ICD-10-CM | POA: Diagnosis not present

## 2023-12-12 DIAGNOSIS — Z79899 Other long term (current) drug therapy: Secondary | ICD-10-CM | POA: Diagnosis not present

## 2023-12-12 DIAGNOSIS — Z9889 Other specified postprocedural states: Secondary | ICD-10-CM | POA: Diagnosis not present

## 2023-12-12 DIAGNOSIS — I480 Paroxysmal atrial fibrillation: Secondary | ICD-10-CM | POA: Diagnosis not present

## 2023-12-12 DIAGNOSIS — Z8679 Personal history of other diseases of the circulatory system: Secondary | ICD-10-CM | POA: Diagnosis not present

## 2023-12-12 DIAGNOSIS — Z7901 Long term (current) use of anticoagulants: Secondary | ICD-10-CM | POA: Diagnosis not present

## 2023-12-12 DIAGNOSIS — I4891 Unspecified atrial fibrillation: Secondary | ICD-10-CM | POA: Diagnosis not present

## 2023-12-20 DIAGNOSIS — I480 Paroxysmal atrial fibrillation: Secondary | ICD-10-CM | POA: Diagnosis not present

## 2023-12-20 DIAGNOSIS — I5032 Chronic diastolic (congestive) heart failure: Secondary | ICD-10-CM | POA: Diagnosis not present

## 2023-12-20 DIAGNOSIS — E119 Type 2 diabetes mellitus without complications: Secondary | ICD-10-CM | POA: Diagnosis not present

## 2024-01-02 IMAGING — CR DG CHEST 2V
3 series · 3 of 3 positions shown · non-contrast
Comparison: None.

CLINICAL DATA: Cough, hemoptysis, shortness of breath

EXAM:
CHEST - 2 VIEW

[chest pa]
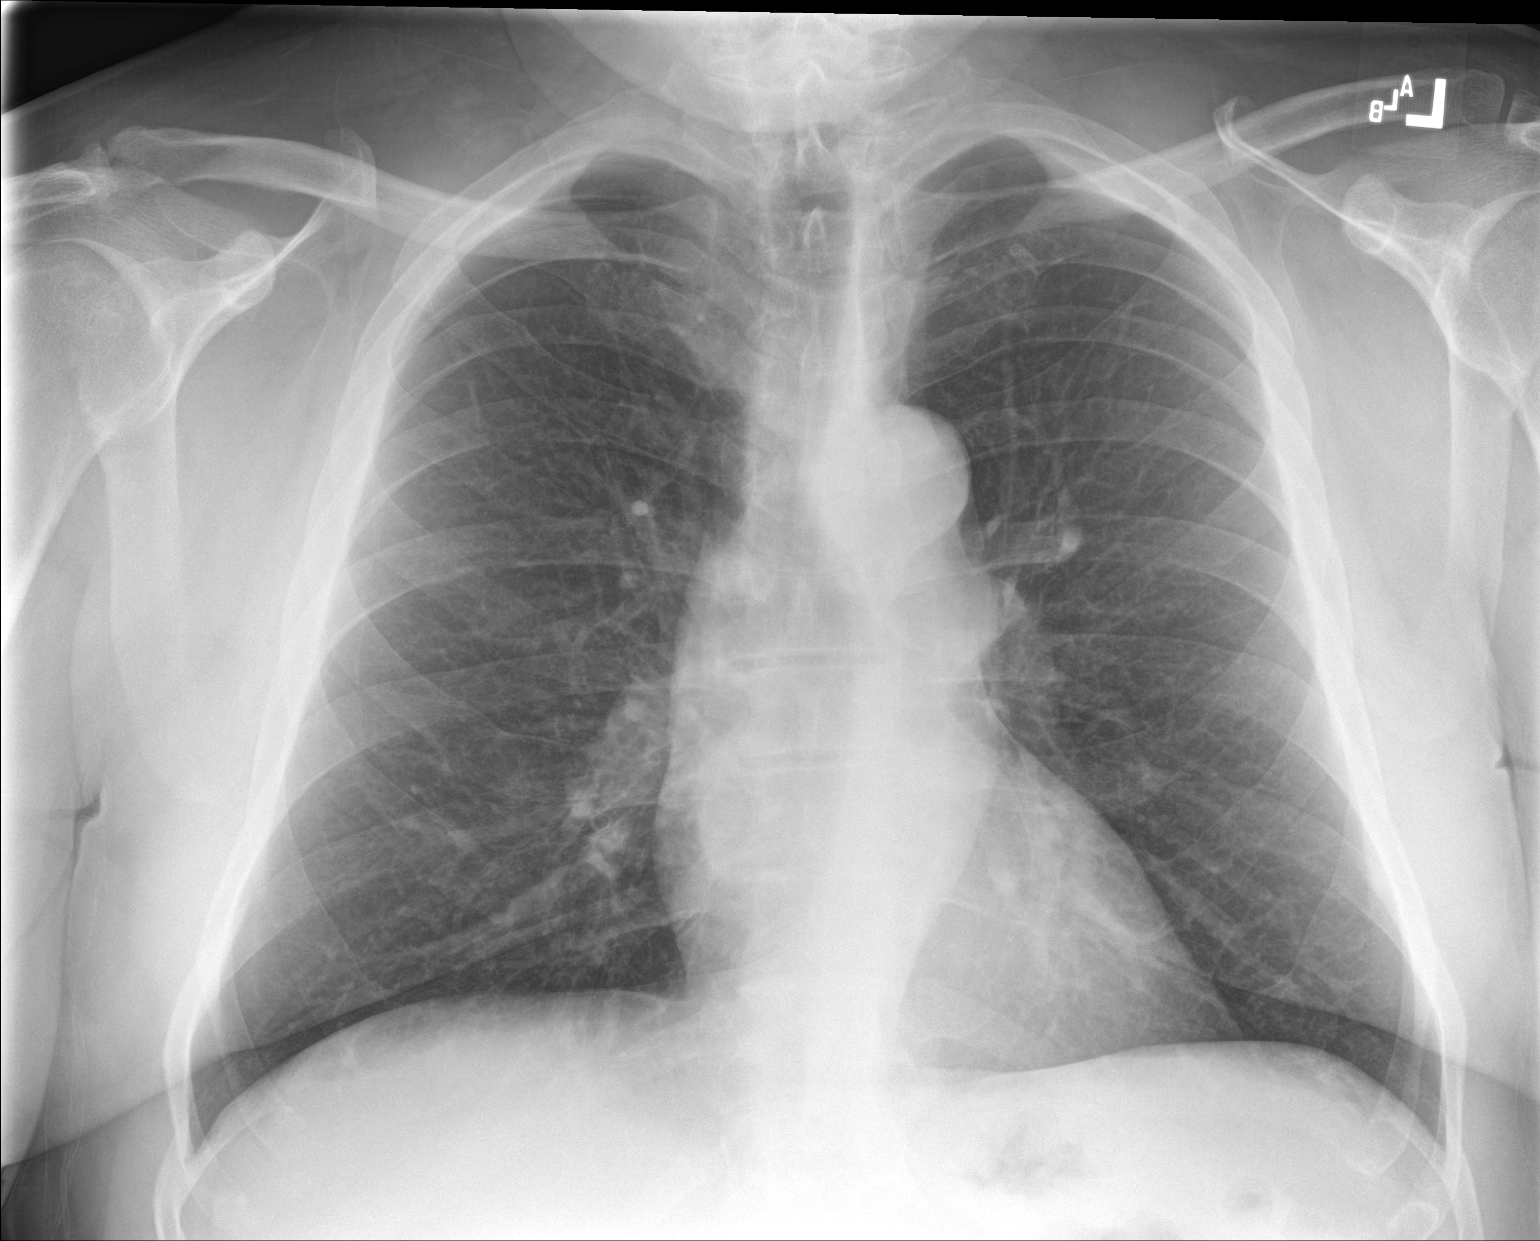

[chest lat (1 of 2)]
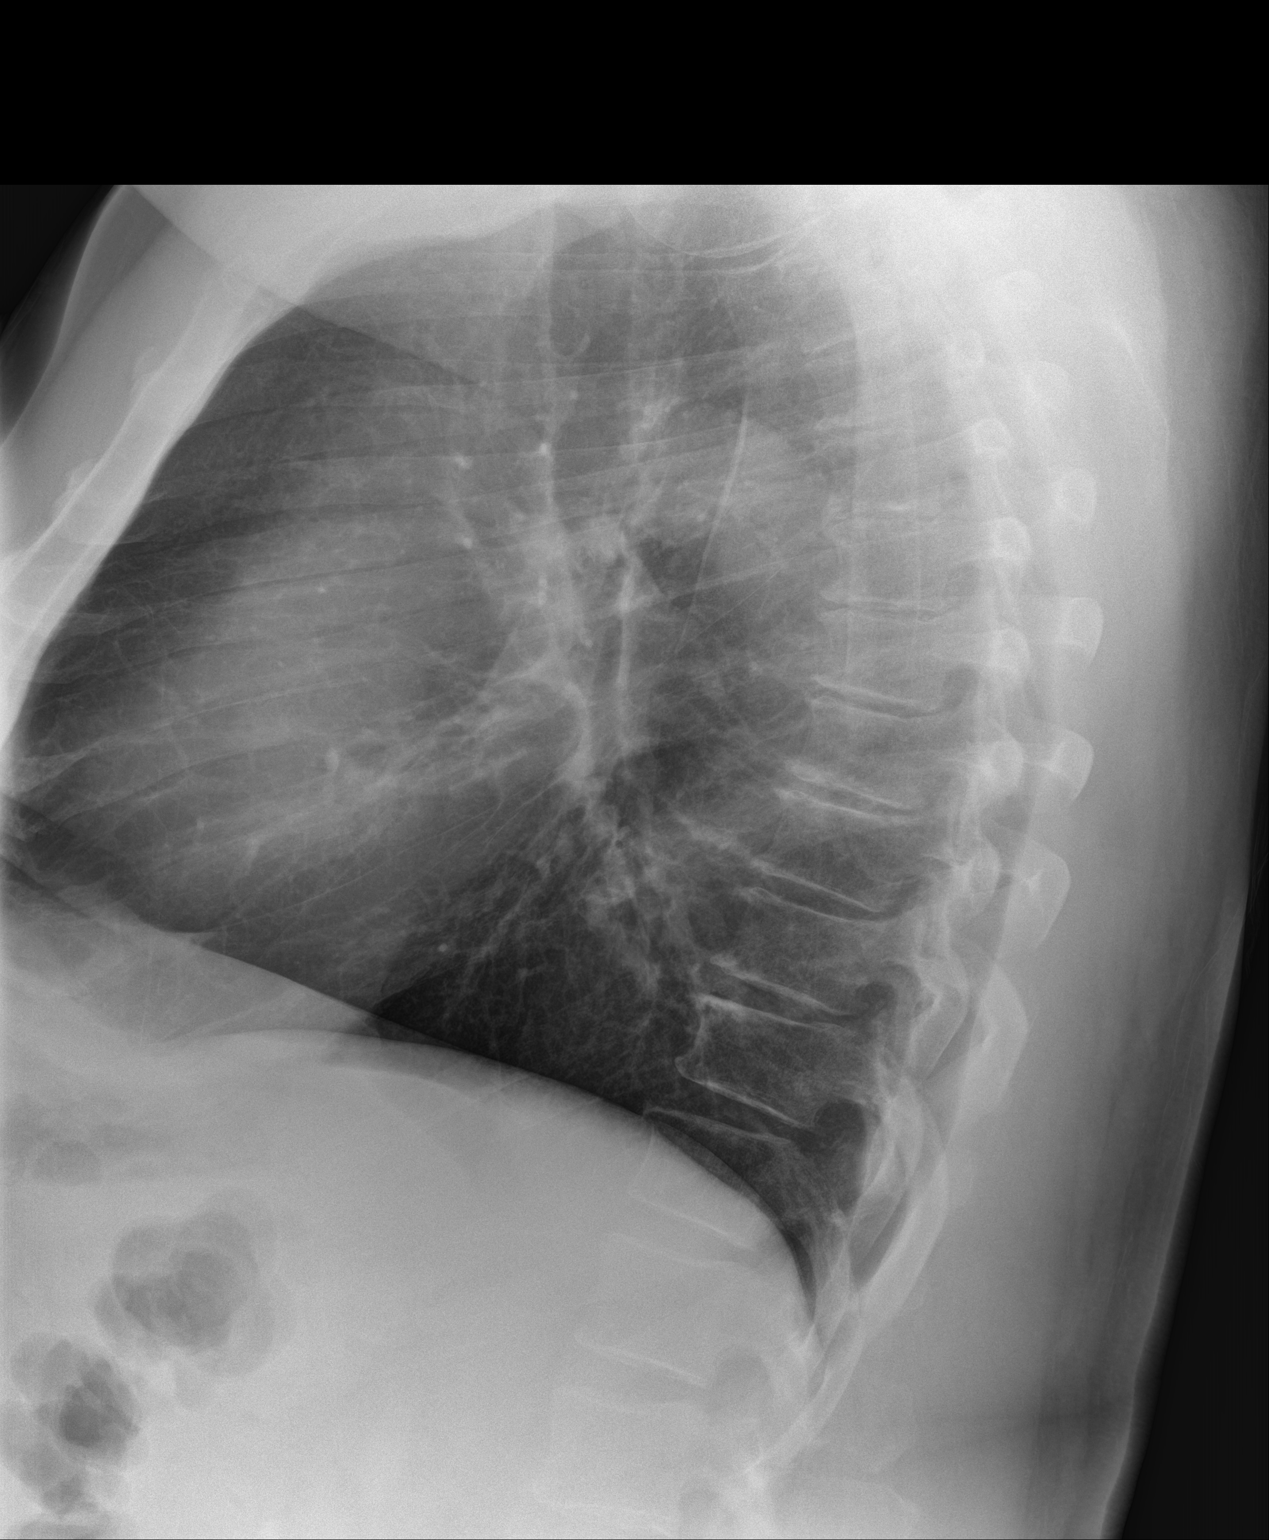

[chest lat (2 of 2)]
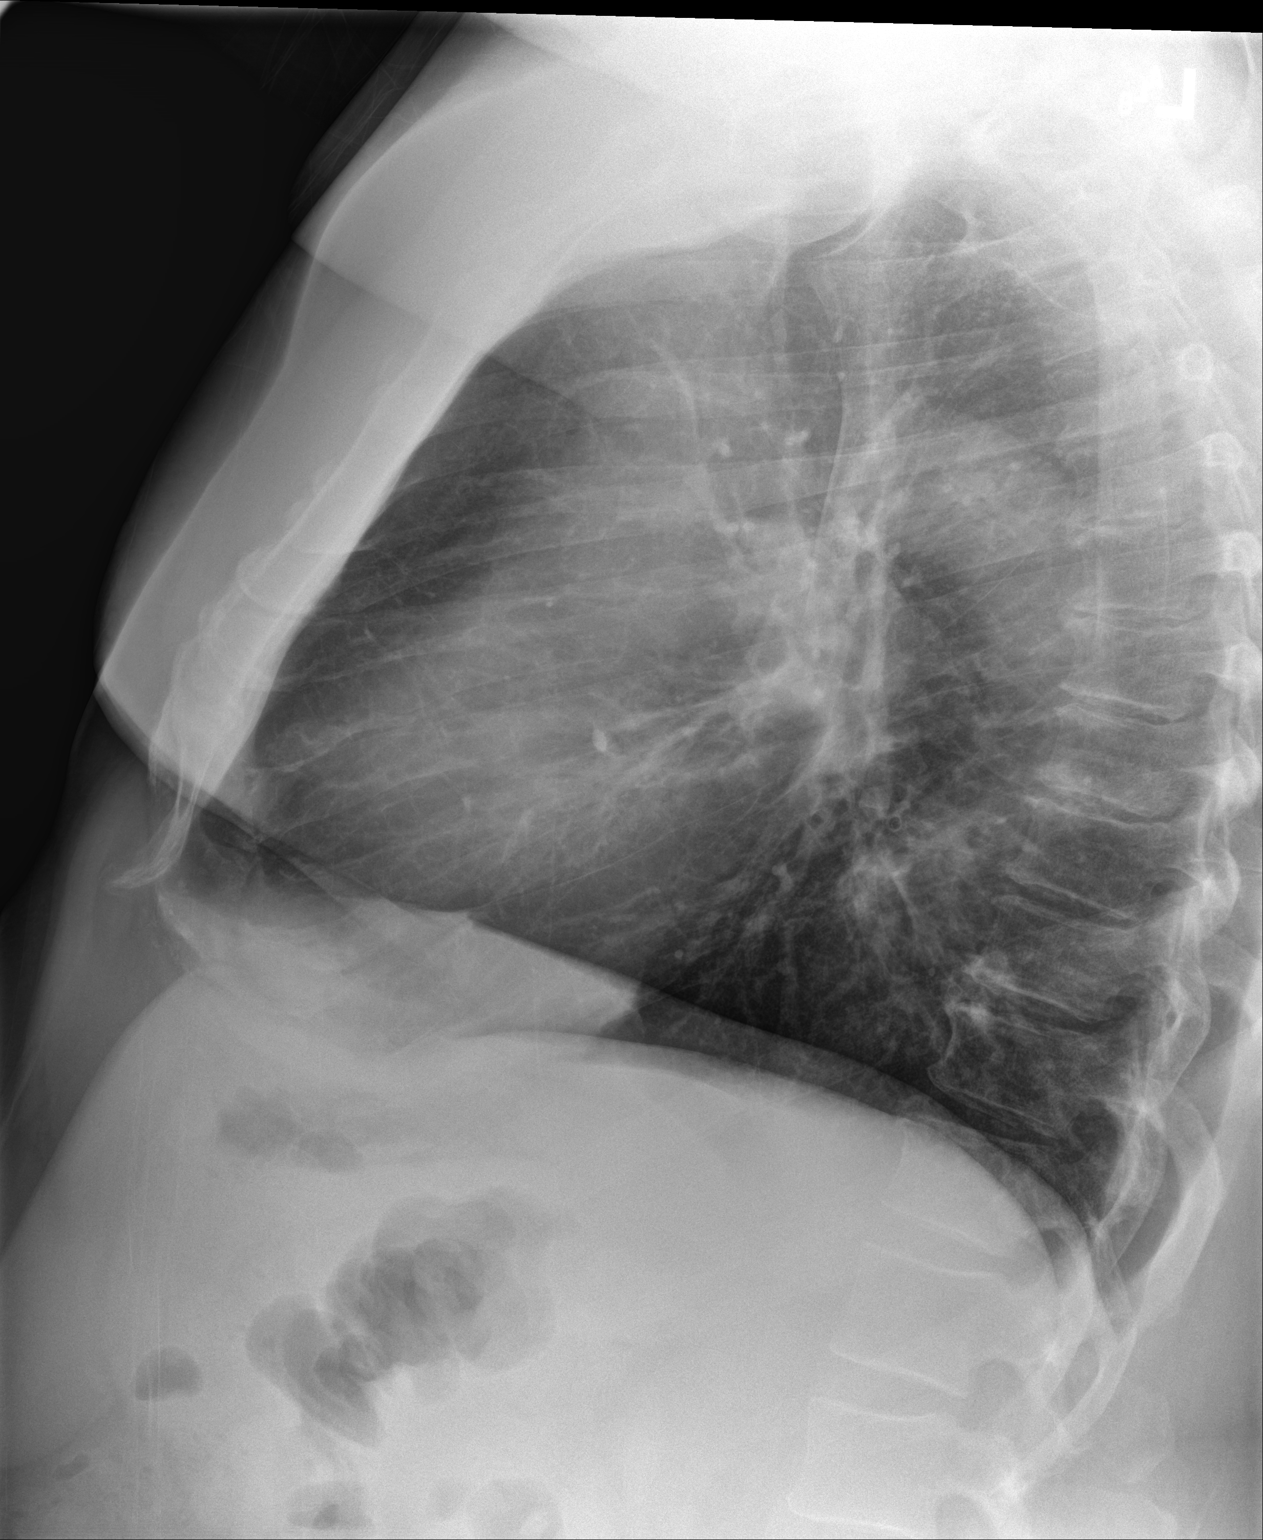

[3 of 3 positions shown; findings below may reference images not displayed]

FINDINGS: Cardiac size is within normal limits. Thoracic aorta is tortuous and
ectatic. There are no signs of pulmonary edema or focal pulmonary
consolidation. There is no pleural effusion or pneumothorax.
IMPRESSION: No active cardiopulmonary disease.

## 2024-01-11 DIAGNOSIS — E119 Type 2 diabetes mellitus without complications: Secondary | ICD-10-CM | POA: Diagnosis not present

## 2024-01-11 DIAGNOSIS — Z8679 Personal history of other diseases of the circulatory system: Secondary | ICD-10-CM | POA: Diagnosis not present

## 2024-01-11 DIAGNOSIS — I5030 Unspecified diastolic (congestive) heart failure: Secondary | ICD-10-CM | POA: Diagnosis not present

## 2024-01-11 DIAGNOSIS — Z79899 Other long term (current) drug therapy: Secondary | ICD-10-CM | POA: Diagnosis not present

## 2024-01-11 DIAGNOSIS — K760 Fatty (change of) liver, not elsewhere classified: Secondary | ICD-10-CM | POA: Diagnosis not present

## 2024-01-11 DIAGNOSIS — Z9889 Other specified postprocedural states: Secondary | ICD-10-CM | POA: Diagnosis not present

## 2024-01-11 DIAGNOSIS — I7121 Aneurysm of the ascending aorta, without rupture: Secondary | ICD-10-CM | POA: Diagnosis not present

## 2024-01-11 DIAGNOSIS — E1100 Type 2 diabetes mellitus with hyperosmolarity without nonketotic hyperglycemic-hyperosmolar coma (NKHHC): Secondary | ICD-10-CM | POA: Diagnosis not present

## 2024-01-11 DIAGNOSIS — E7800 Pure hypercholesterolemia, unspecified: Secondary | ICD-10-CM | POA: Diagnosis not present

## 2024-01-16 DIAGNOSIS — I480 Paroxysmal atrial fibrillation: Secondary | ICD-10-CM | POA: Diagnosis not present

## 2024-01-16 DIAGNOSIS — I5032 Chronic diastolic (congestive) heart failure: Secondary | ICD-10-CM | POA: Diagnosis not present

## 2024-01-16 DIAGNOSIS — I10 Essential (primary) hypertension: Secondary | ICD-10-CM | POA: Diagnosis not present

## 2024-01-16 DIAGNOSIS — G4733 Obstructive sleep apnea (adult) (pediatric): Secondary | ICD-10-CM | POA: Diagnosis not present

## 2024-01-16 DIAGNOSIS — E119 Type 2 diabetes mellitus without complications: Secondary | ICD-10-CM | POA: Diagnosis not present

## 2024-02-15 DIAGNOSIS — G4733 Obstructive sleep apnea (adult) (pediatric): Secondary | ICD-10-CM | POA: Diagnosis not present

## 2024-03-13 DIAGNOSIS — I4891 Unspecified atrial fibrillation: Secondary | ICD-10-CM | POA: Diagnosis not present

## 2024-03-13 DIAGNOSIS — R0609 Other forms of dyspnea: Secondary | ICD-10-CM | POA: Diagnosis not present

## 2024-03-13 DIAGNOSIS — E1100 Type 2 diabetes mellitus with hyperosmolarity without nonketotic hyperglycemic-hyperosmolar coma (NKHHC): Secondary | ICD-10-CM | POA: Diagnosis not present

## 2024-03-13 DIAGNOSIS — Z5181 Encounter for therapeutic drug level monitoring: Secondary | ICD-10-CM | POA: Diagnosis not present

## 2024-03-13 DIAGNOSIS — Z79899 Other long term (current) drug therapy: Secondary | ICD-10-CM | POA: Diagnosis not present

## 2024-03-20 DIAGNOSIS — G4733 Obstructive sleep apnea (adult) (pediatric): Secondary | ICD-10-CM | POA: Diagnosis not present

## 2024-03-20 DIAGNOSIS — I10 Essential (primary) hypertension: Secondary | ICD-10-CM | POA: Diagnosis not present

## 2024-03-20 DIAGNOSIS — K219 Gastro-esophageal reflux disease without esophagitis: Secondary | ICD-10-CM | POA: Diagnosis not present

## 2024-03-20 DIAGNOSIS — I5032 Chronic diastolic (congestive) heart failure: Secondary | ICD-10-CM | POA: Diagnosis not present

## 2024-03-20 DIAGNOSIS — E119 Type 2 diabetes mellitus without complications: Secondary | ICD-10-CM | POA: Diagnosis not present

## 2024-03-20 DIAGNOSIS — I480 Paroxysmal atrial fibrillation: Secondary | ICD-10-CM | POA: Diagnosis not present
